# Patient Record
Sex: Male | Born: 1965 | Race: White | Hispanic: No | Marital: Married | Smoking: Never smoker
Health system: Southern US, Community
[De-identification: ages and names within clinical notes are randomized; demographics above are authoritative.]

---

## 2009-06-10 ENCOUNTER — Ambulatory Visit: Payer: Self-pay | Admitting: Family Medicine

## 2009-06-10 DIAGNOSIS — J180 Bronchopneumonia, unspecified organism: Secondary | ICD-10-CM | POA: Insufficient documentation

## 2015-12-14 ENCOUNTER — Emergency Department (INDEPENDENT_AMBULATORY_CARE_PROVIDER_SITE_OTHER): Payer: BLUE CROSS/BLUE SHIELD

## 2015-12-14 ENCOUNTER — Emergency Department
Admission: EM | Admit: 2015-12-14 | Discharge: 2015-12-14 | Disposition: A | Discharge status: Home / Self Care | Payer: BLUE CROSS/BLUE SHIELD | Source: Home / Self Care | Attending: Family Medicine | Admitting: Family Medicine

## 2015-12-14 ENCOUNTER — Encounter: Payer: Self-pay | Admitting: Emergency Medicine

## 2015-12-14 DIAGNOSIS — M79604 Pain in right leg: Secondary | ICD-10-CM | POA: Diagnosis not present

## 2015-12-14 DIAGNOSIS — M25571 Pain in right ankle and joints of right foot: Secondary | ICD-10-CM

## 2015-12-14 DIAGNOSIS — M79601 Pain in right arm: Secondary | ICD-10-CM

## 2015-12-14 DIAGNOSIS — L298 Other pruritus: Secondary | ICD-10-CM

## 2015-12-14 DIAGNOSIS — M79671 Pain in right foot: Secondary | ICD-10-CM

## 2015-12-14 DIAGNOSIS — R030 Elevated blood-pressure reading, without diagnosis of hypertension: Secondary | ICD-10-CM

## 2015-12-14 DIAGNOSIS — IMO0001 Reserved for inherently not codable concepts without codable children: Secondary | ICD-10-CM

## 2015-12-14 MED ORDER — MELOXICAM 7.5 MG PO TABS: ORAL_TABLET | ORAL | Status: AC

## 2015-12-14 MED ORDER — PREDNISONE 20 MG PO TABS: ORAL_TABLET | ORAL | Status: DC

## 2015-12-14 MED ORDER — HYDROXYZINE HCL 25 MG PO TABS: 25.0000 mg | ORAL_TABLET | Freq: Four times a day (QID) | ORAL | Status: AC | PRN

## 2015-12-14 NOTE — ED Notes (Signed)
Pt to return for u/s here at 845 on 12/28. Pt aware.

## 2015-12-14 NOTE — ED Notes (Signed)
Pt c/o right leg and foot pain that started randomly about 2 weeks ago. States pain is constant and he denies injury, warmth or redness.

## 2015-12-14 NOTE — Discharge Instructions (Signed)
°  Meloxicam (Mobic) is an antiinflammatory to help with pain and inflammation.  Do not take ibuprofen, Advil, Aleve, or any other medications that contain NSAIDs while taking meloxicam as this may cause stomach upset or even ulcers if taken in large amounts for an extended period of time.  ° °

## 2015-12-14 NOTE — ED Provider Notes (Signed)
CSN: [id number]     Arrival date & time 12/14/15  4098 History   First MD Initiated Contact with Patient 12/14/15 [phone]     Chief Complaint  Patient presents with  . Leg Pain   (Consider location/radiation/quality/duration/timing/severity/associated sxs/prior Treatment) HPI Pt is a 49yo male presenting to [facility] with c/o Right lower leg, ankle and foot pain for 2 weeks that started suddenly on its own. Pain has been constant but waxing and waning in severity. Pain is 10/10 at worst but currently 5/10. Nothing seems to make pain better or worse. Pt states even elevating his leg in the recliner causes his Right foot to feel heavy and painful.  Denies known injury but states he walks on his feet often which is not new for him. Denies new shoes.  Pt does note there is alcohol in the house so there is a chance he could have injured his leg but does not recall.  Denies hx of blood clots. Denies recent long travel. Denies leg swelling or redness.   Pt also c/o an unrelated rash in his axillary region of Left and Right side, worse on Left side. He notes a very red rash that is moderately pruritic.  He believes it could have been due to his deodorant but he has not used any deodorant for over 1 week due to the rash, yet the rash continues to worsen. Denies exposure to known allergies of Sulfa and no other known allergies. Denies fever, chills, n/v/d. He has not tried benadryl, hydrocortisone cream, or any other medications for his rash.    Pt has elevated BP of 174/108 in triage. Pt denies headache, dizziness, chest pain or SOB. Pt states he has hx of HTN and did take his BP medication this morning. Pt states his BP is usually high when he is at the doctor's office.  History reviewed. No pertinent past medical history. History reviewed. No pertinent past surgical history. Family History  Problem Relation Age of Onset  . Diabetes Father    Social History  Substance Use Topics  . Smoking status: Never  Smoker   . Smokeless tobacco: Never Used  . Alcohol Use: Yes    Review of Systems  Constitutional: Negative for fever and chills.  Gastrointestinal: Negative for nausea, vomiting and diarrhea.  Musculoskeletal: Positive for myalgias and arthralgias. Negative for joint swelling.       Right leg, ankle and foot pain  Skin: Positive for rash ( bilateral under arms). Negative for color change and wound.  Neurological: Positive for numbness ( mild intermittent Right foot). Negative for weakness.    Allergies  Sulfonamide derivatives  Home Medications   Prior to Admission medications   Medication Sig Start Date End Date Taking? Authorizing Provider  levothyroxine (SYNTHROID, LEVOTHROID) 50 MCG tablet Take 50 mcg by mouth daily before breakfast.   Yes Historical Provider, MD  hydrOXYzine (ATARAX/VISTARIL) 25 MG tablet Take 1 tablet (25 mg total) by mouth every 6 (six) hours as needed for itching. 12/14/15   Junius Finner, PA-C  meloxicam (MOBIC) 7.5 MG tablet Take 2 tabs ( ) daily for 5 days, then take 1 tab (7.5mg ) daily as needed for pain 12/14/15   Junius Finner, PA-C  predniSONE (DELTASONE) 20 MG tablet 3 tabs po day one, then 2 po daily x 4 days 12/14/15   Junius Finner, PA-C   Meds Ordered and Administered this Visit  Medications - No data to display  BP 180/112 mmHg  Pulse 102  Temp(Src) 98.6 F (  37 C) (Oral)  Wt 204 lb (92.534 kg)  SpO2 99% No data found.   Physical Exam  Constitutional: He is oriented to person, place, and time. He appears well-developed and well-nourished.  HENT:  Head: Normocephalic and atraumatic.  Eyes: Conjunctivae are normal. No scleral icterus.  Neck: Normal range of motion.  Cardiovascular: Normal rate, regular rhythm and normal heart sounds.   Pulses:      Dorsalis pedis pulses are 2+ on the right side.       Posterior tibial pulses are 2+ on the right side.  Strong pedal pulses. Cap refill < 3 seconds  Pulmonary/Chest: Effort normal. No  respiratory distress.  Musculoskeletal: Normal range of motion. He exhibits tenderness. He exhibits no edema.  Right hip and knee: full ROM w/o tenderness. Right lower leg: tenderness to anterior muscles along tibial bone prominence.  Calf is soft and non-tender. Right ankle: tenderness around lateral malleolus. Full ROM. No edema. Right foot: tenderness to dorsal aspect. Full ROM all toes.    Neurological: He is alert and oriented to person, place, and time.  Skin: Skin is warm and dry. Rash noted. There is erythema.  Left axillary region: diffuse erythematous maculopapular rash, non-tender. No bleeding or discharge.  Right axillary: 3cm area of erythematous maculopapular rash to anterior portion of axilla. Non-tender. No bleeding or discharge. No induration or fluctuance of rashes   Nursing note and vitals reviewed.   ED Course  Procedures (including critical care time)  Labs Review Labs Reviewed - No data to display  Imaging Review Dg Tibia/fibula Right  12/14/2015  CLINICAL DATA:  RIGHT lower extremity pain for 2 weeks, no known injury, anterior RIGHT lower leg pain EXAM: RIGHT TIBIA AND FIBULA - 2 VIEW COMPARISON:  None FINDINGS: Osseous mineralization normal. Joint spaces preserved. No fracture, dislocation, or bone destruction. IMPRESSION: Normal exam. Electronically Signed   By: Ulyses SouthwardMark  Boles M.D.   On: 12/14/2015 10:37   Dg Ankle Complete Right  12/14/2015  CLINICAL DATA:  Two week history of ankle region pain, primarily lateral EXAM: RIGHT ANKLE - COMPLETE 3+ VIEW COMPARISON:  None. FINDINGS: Frontal, oblique, and lateral views were obtained. There is soft tissue swelling laterally. There is no demonstrable fracture or joint effusion. The ankle mortise appears intact. There is mild spurring along the medial malleolus. IMPRESSION: Soft tissue swelling laterally. No fracture. Mortise intact. Slight osteoarthritic change medially. Electronically Signed   By: Bretta BangWilliam  Woodruff III M.D.    On: 12/14/2015 10:35   Dg Foot Complete Right  12/14/2015  CLINICAL DATA:  RIGHT lower extremity pain for 2 weeks, no known injury, dorsal foot pain, lateral ankle pain EXAM: RIGHT FOOT COMPLETE - 3+ VIEW COMPARISON:  None FINDINGS: Osseous mineralization normal. Joint spaces preserved. No fracture, dislocation, or bone destruction. IMPRESSION: Normal exam. Electronically Signed   By: Ulyses SouthwardMark  Boles M.D.   On: 12/14/2015 10:35      MDM   1. Pruritic erythematous rash   2. Right ankle pain   3. Right foot pain   4. Anterior leg pain, right   5. Right leg pain   6. Elevated blood pressure    Pt c/o persistent Right Leg pain for 2 weeks. Possible injury but does not recall specific injury.  No hx of blood clots. Denies chest pain or SOB. Right leg: tenderness to anterior tibialis muscle and Lateral malleolus. No evidence of underlying infection. Low concern for DVT, however, plain films are unremarkable. Recommend U/S to r/o DVT.  U/S  is unavailable today at this facility, pt stated he would prefer to come back at 8:45AM to [facility] than go to emergency department today for U/S.    Pt also c/o pruritic erythematous rash. Exam c/w contact dermatitis vs atopic dermatitis. Will tx with 5 day course of prednisone and atarax for pruritis.  Advised to f/u in 3-4 days if rash not improving, sooner if worsening.   Elevated blood pressure: 174/108, recheck was 180/112.  Pt denies headache, dizziness, chest pain or SOB. Pt states he did take his BP meds this morning. Encouraged pt to f/u with his PCP for recheck and further treatment of elevated BP.  Patient verbalized understanding and agreement with treatment plan.   Junius Finner, PA-C 12/14/15 1106

## 2015-12-15 ENCOUNTER — Other Ambulatory Visit: Payer: Self-pay | Admitting: Emergency Medicine

## 2015-12-15 ENCOUNTER — Other Ambulatory Visit: Payer: BLUE CROSS/BLUE SHIELD

## 2015-12-15 ENCOUNTER — Ambulatory Visit: Payer: BLUE CROSS/BLUE SHIELD

## 2015-12-15 ENCOUNTER — Telehealth: Payer: Self-pay | Admitting: *Deleted

## 2015-12-15 DIAGNOSIS — M25571 Pain in right ankle and joints of right foot: Secondary | ICD-10-CM

## 2015-12-15 DIAGNOSIS — M79671 Pain in right foot: Secondary | ICD-10-CM

## 2016-09-10 IMAGING — US US EXTREM LOW VENOUS*R*
1 series · 13 of 24 positions shown · non-contrast
Comparison: None.

CLINICAL DATA: Right foot and ankle pain for 2 weeks. No known
injury



[Series 1: us extrem low venous*right* · 0.06mm/px · 13 of 27 slices shown]
[im 1/27]
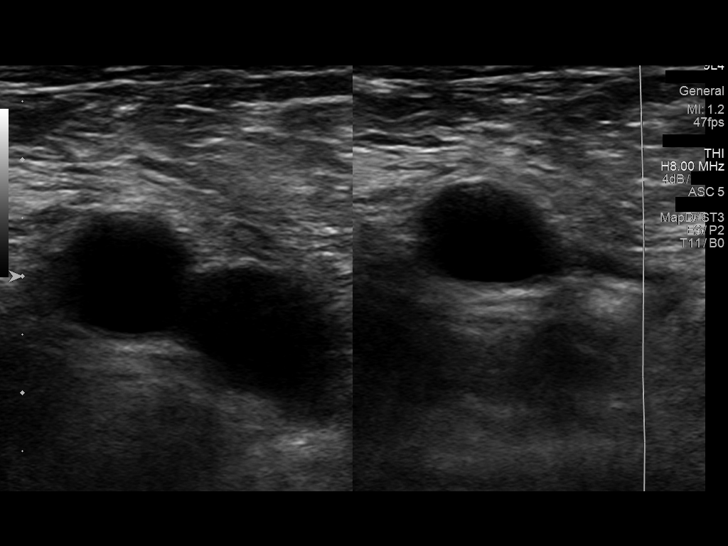
[im 3/27]
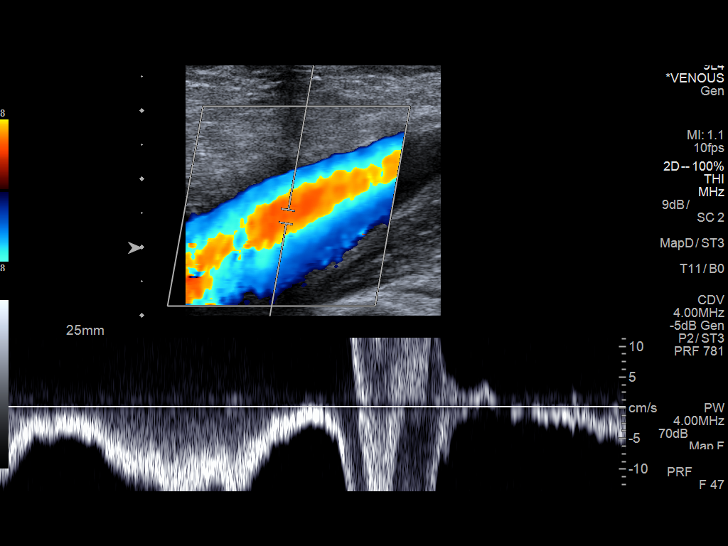
[im 5/27]
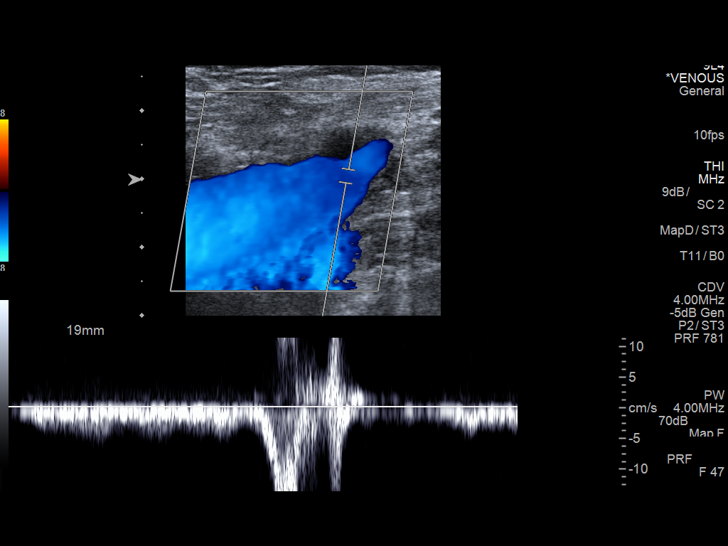
[im 7/27]
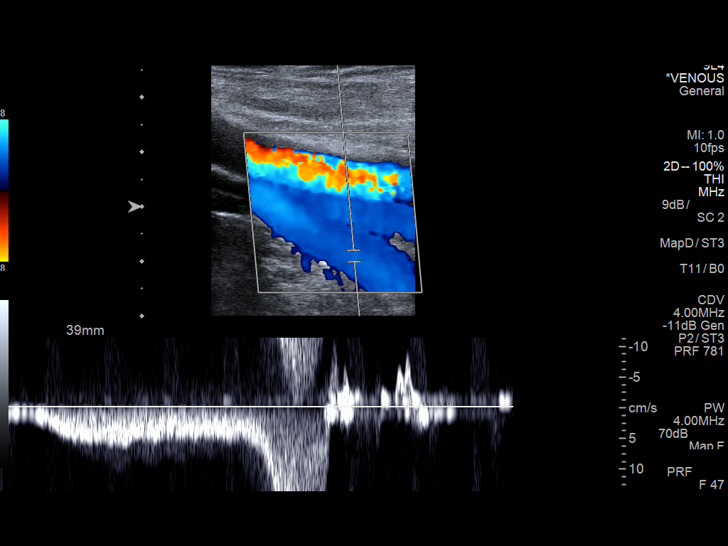
[im 10/27]
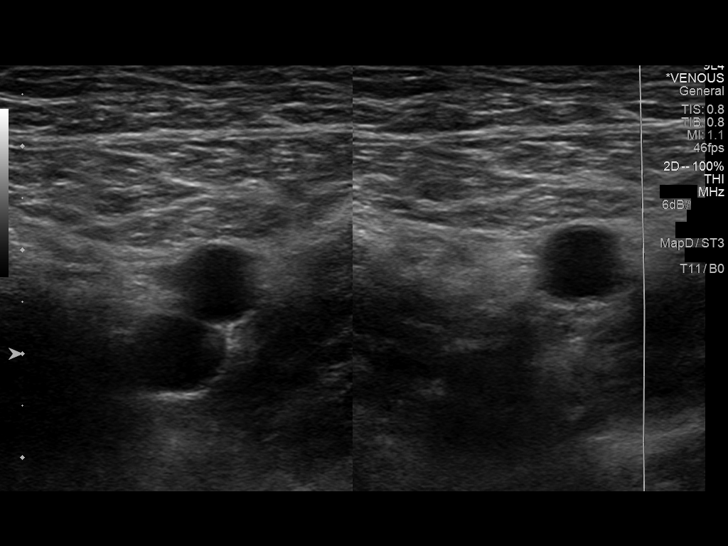
[im 12/27]
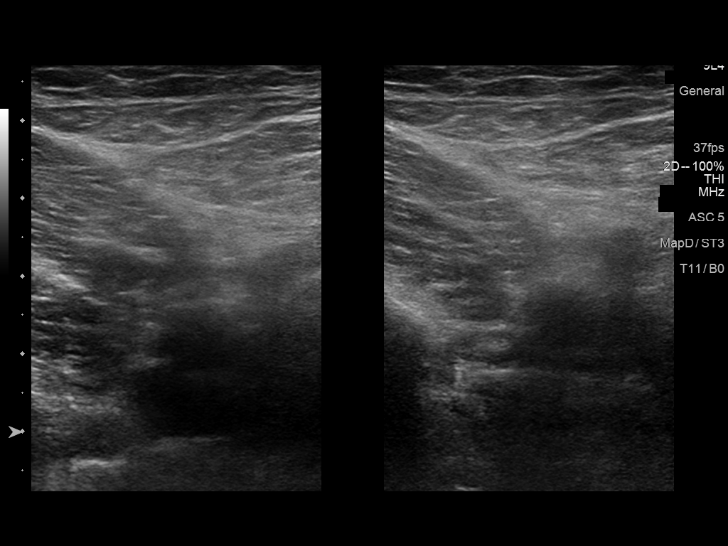
[im 14/27]
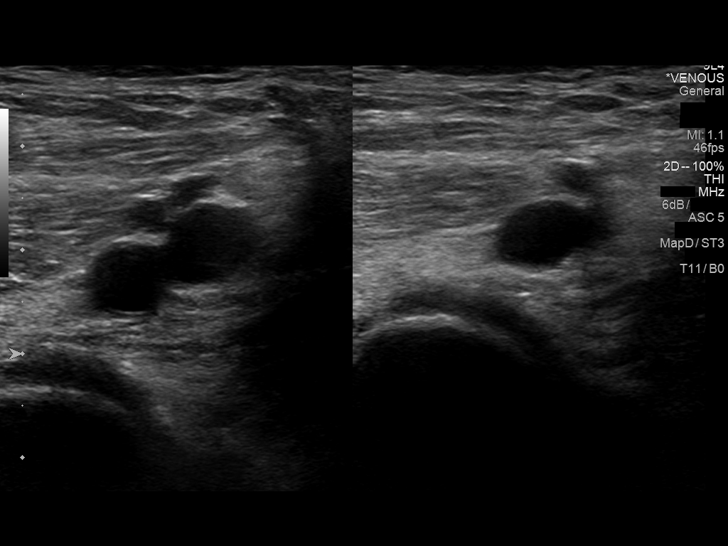
[im 15/27]
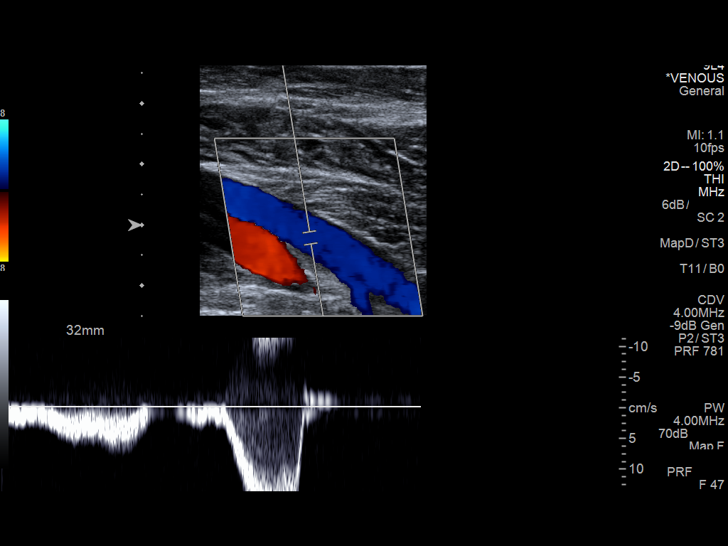
[im 17/27]
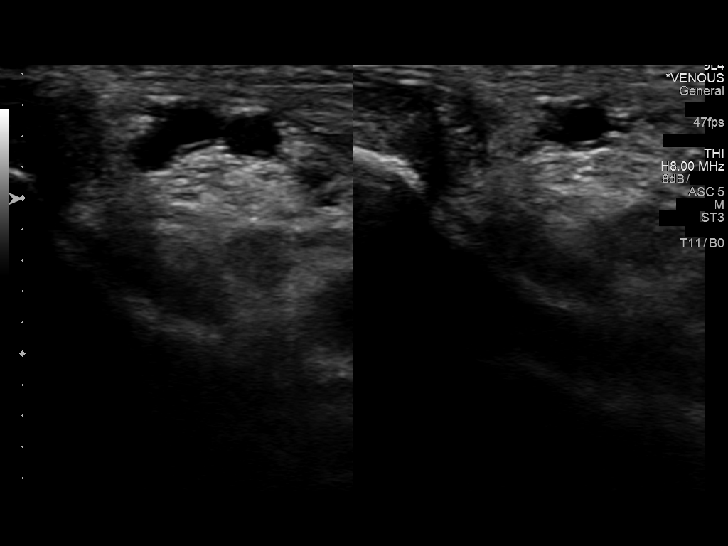
[im 20/27]
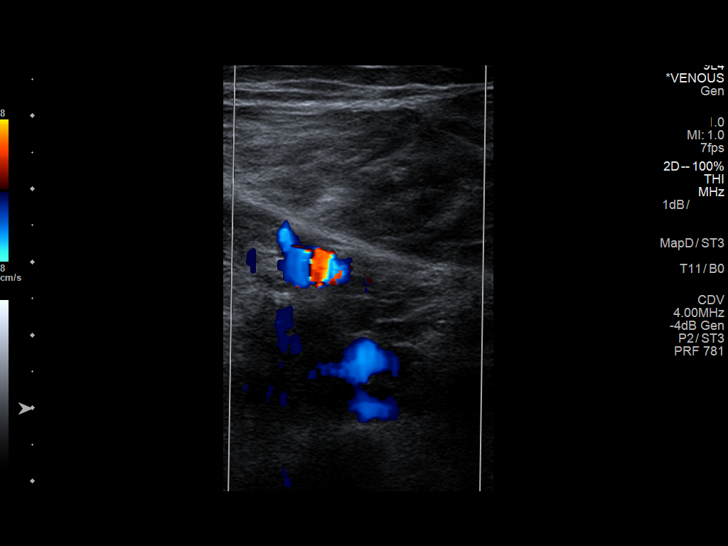
[im 22/27]
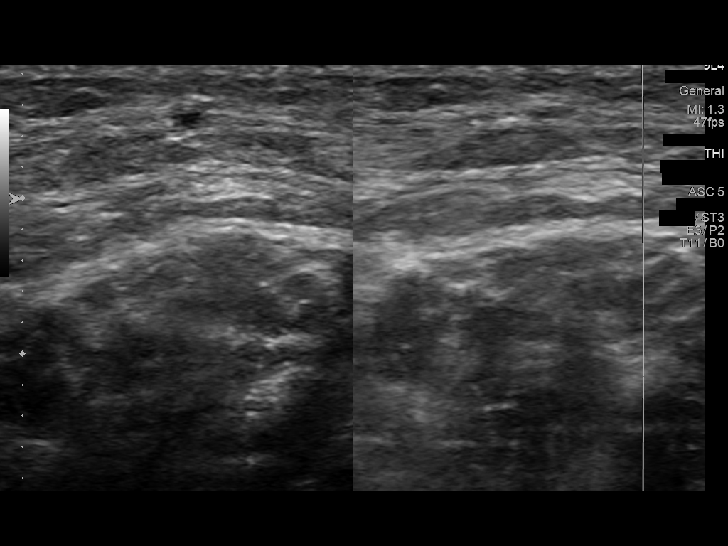
[im 24/27]
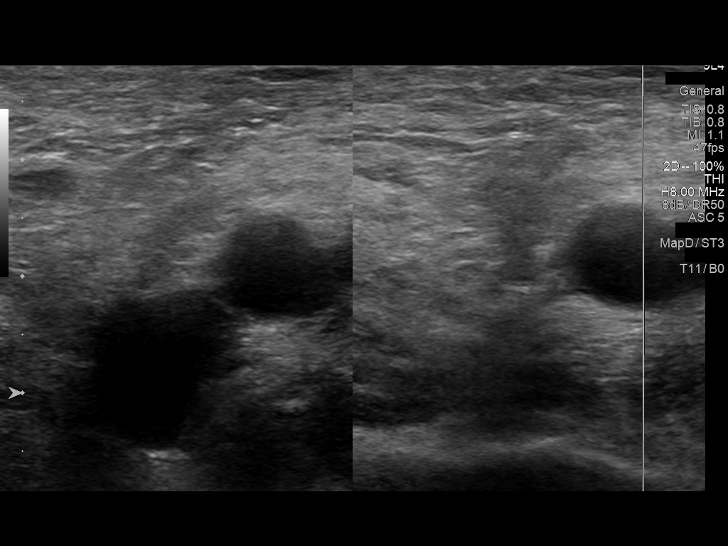
[im 27/27]
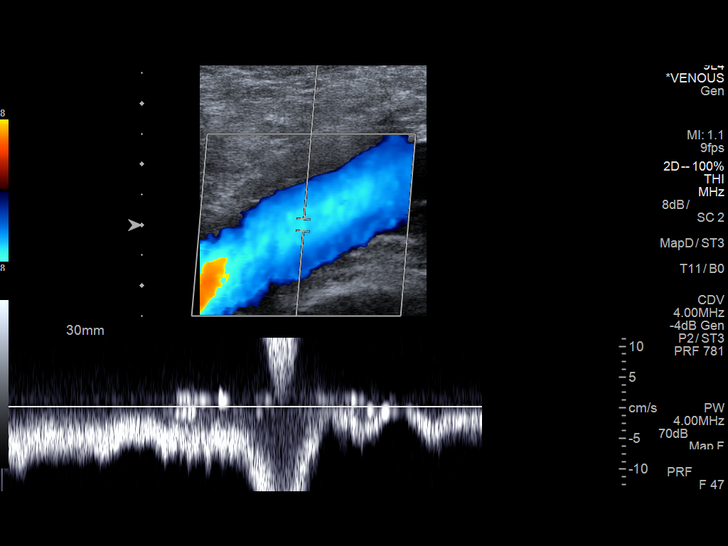

[13 of 24 positions shown; findings below may reference images not displayed]

FINDINGS: Contralateral Common Femoral Vein: Respiratory phasicity is normal
and symmetric with the symptomatic side. No evidence of thrombus.
Normal compressibility.

Common Femoral Vein: No evidence of thrombus. Normal
compressibility, respiratory phasicity and response to augmentation.

Saphenofemoral Junction: No evidence of thrombus. Normal
compressibility and flow on color Doppler imaging.

Profunda Femoral Vein: No evidence of thrombus. Normal
compressibility and flow on color Doppler imaging.

Femoral Vein: No evidence of thrombus. Normal compressibility,
respiratory phasicity and response to augmentation.

Popliteal Vein: No evidence of thrombus. Normal compressibility,
respiratory phasicity and response to augmentation.

Calf Veins: No evidence of thrombus. Normal compressibility and flow
on color Doppler imaging.

Superficial Great Saphenous Vein: No evidence of thrombus. Normal
compressibility and flow on color Doppler imaging.

Venous Reflux:  None.

Other Findings:  None.
IMPRESSION: No evidence of DVT within the right lower extremity.

## 2017-07-08 ENCOUNTER — Encounter: Payer: Self-pay | Admitting: Emergency Medicine

## 2017-07-08 ENCOUNTER — Emergency Department
Admission: EM | Admit: 2017-07-08 | Discharge: 2017-07-08 | Disposition: A | Discharge status: Home / Self Care | Payer: BLUE CROSS/BLUE SHIELD | Source: Home / Self Care | Attending: Family Medicine | Admitting: Family Medicine

## 2017-07-08 ENCOUNTER — Emergency Department (INDEPENDENT_AMBULATORY_CARE_PROVIDER_SITE_OTHER): Payer: BLUE CROSS/BLUE SHIELD

## 2017-07-08 DIAGNOSIS — R0789 Other chest pain: Secondary | ICD-10-CM | POA: Diagnosis not present

## 2017-07-08 DIAGNOSIS — S29011A Strain of muscle and tendon of front wall of thorax, initial encounter: Secondary | ICD-10-CM

## 2017-07-08 MED ORDER — HYDROCODONE-ACETAMINOPHEN 5-325 MG PO TABS: 1.0000 | ORAL_TABLET | Freq: Four times a day (QID) | ORAL | 0 refills | Status: AC | PRN
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
## 2017-07-08 NOTE — ED Triage Notes (Signed)
Patient having difficulty taking deep breathes on the right side of his chest, he is having pain up under his arm.  No apparent injury, pain when coughing and sneezing.

## 2017-07-08 NOTE — Discharge Instructions (Signed)
Apply ice pack for 20 to 30 minutes, 2 to 3  times daily  Continue until pain and swelling decrease.  Wear rib belt until pain improves.  Continue Ibuprofen 200mg , 4 tabs every 8 hours with food.  If symptoms become significantly worse during the night or over the weekend, proceed to the local emergency room.
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
## 2022-06-21 LAB — COLOGUARD: COLOGUARD: POSITIVE — AB
